# Patient Record
Sex: Male | Born: 1995 | Race: White | Hispanic: No | Marital: Single | Smoking: Never smoker
Health system: Southern US, Community
[De-identification: ages and names within clinical notes are randomized; demographics above are authoritative.]

## PROBLEM LIST (undated history)

## (undated) DIAGNOSIS — L709 Acne, unspecified: Secondary | ICD-10-CM

## (undated) HISTORY — DX: Acne, unspecified: L70.9

## (undated) HISTORY — PX: OTHER SURGICAL HISTORY: SHX169

---
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
## 2016-05-25 ENCOUNTER — Ambulatory Visit (INDEPENDENT_AMBULATORY_CARE_PROVIDER_SITE_OTHER): Payer: PRIVATE HEALTH INSURANCE | Admitting: Family

## 2016-05-25 ENCOUNTER — Encounter: Payer: Self-pay | Admitting: Family

## 2016-05-25 VITALS — BP 140/84 | HR 86 | Temp 98.5°F | Ht 69.0 in | Wt 206.0 lb

## 2016-05-25 DIAGNOSIS — B070 Plantar wart: Secondary | ICD-10-CM | POA: Diagnosis not present

## 2016-05-25 NOTE — Patient Instructions (Signed)
Plantar Warts Warts are small growths on the skin. They can occur on various areas of the body. When they occur on the underside (sole) of the foot, they are called plantar warts. Plantar warts often occur in groups, with several small warts around a larger growth. They tend to develop over areas of pressure, such as the heel or the ball of the foot. Most warts are not painful, and they usually do not cause problems. However, plantar warts may cause pain when you walk because pressure is applied to them. Warts often go away on their own in time. Various treatments may be done if needed. Sometimes, warts go away and then they come back again. CAUSES Plantar warts are caused by a type of virus that is called human papillomavirus (HPV). HPV attacks a break in the skin of the foot. Walking barefoot can lead to exposure to the virus. These warts may spread to other areas of the sole. They spread to other areas of the body only through direct contact. RISK FACTORS Plantar warts are more likely to develop in:  People who are 10-20 years of age.  People who use public showers or locker rooms.  People who have a weakened body defense system (immune system). SYMPTOMS Plantar warts may be flat or slightly raised. They may grow into the deeper layers of skin or rise above the surface of the skin. Most plantar warts have a rough surface. They may cause pain when you use your foot to support your body weight. DIAGNOSIS A plantar wart can usually be diagnosed from its appearance. In some cases, a tissue sample may be removed (biopsy) to be looked at under a microscope. TREATMENT In many cases, warts do not need treatment. Without treatment, they often go away over a period of many months to a couple years. If treatment is needed, options may include:  Applying medicated solutions, creams, or patches to the wart. These may be over-the-counter or prescription medicines that make the skin soft so that layers will  gradually shed away. In many cases, the medicine is applied one or two times per day and covered with a bandage.  Putting duct tape over the top of the wart (occlusion). You will leave the tape in place for as long as told by your health care provider, then you will replace it with a new strip of tape. This is done until the wart goes away.  Freezing the wart with liquid nitrogen (cryotherapy).  Burning the wart with:  Laser treatment.  An electrified probe (electrocautery).  Injection of a medicine (Candida antigen) into the wart to help the body's immune system to fight off the wart.  Surgery to remove the wart. HOME CARE INSTRUCTIONS  Apply medicated creams or solutions only as told by your health care provider. This may involve:  Soaking the affected area in warm water.  Removing the top layer of softened skin before you apply the medicine. A pumice stone works well for removing the tissue.  Applying a bandage over the affected area after you apply the medicine.  Repeating the process daily or as told by your health care provider.  Do not scratch or pick at a wart.  Wash your hands after you touch a wart.  If a wart is painful, try applying a bandage with a hole in the middle over the wart. The helps to take pressure off the wart.  Keep all follow-up visits as told by your health care provider. This is important. PREVENTION   Take these actions to help prevent warts:  Wear shoes and socks. Change your socks daily.  Keep your feet clean and dry.  Check your feet regularly.  Avoid direct contact with warts on other people. SEEK MEDICAL CARE IF:  Your warts do not improve after treatment.  You have redness, swelling, or pain at the site of a wart.  You have bleeding from a wart that does not stop with light pressure.  You have diabetes and you develop a wart.   This information is not intended to replace advice given to you by your health care provider. Make sure  you discuss any questions you have with your health care provider.   Document Released: 02/12/2004 Document Revised: 08/13/2015 Document Reviewed: 02/17/2015 Elsevier Interactive Patient Education 2016 [employer]  

## 2016-05-25 NOTE — Progress Notes (Signed)
   Subjective:    Patient ID: Dale Lloyd, male    DOB: 02-11-1996, 20 y.o.   MRN: [id number]  HPI PT presents to the office with a "bump" on the ball of his right foot. PT states it has been there for about 2 months and states it is unchanged. Pt states when he applies pressure it becomes painful. PT states he soaked it in water and scrubbed with no relief.    Review of Systems  Constitutional: Negative.   HENT: Negative.   Respiratory: Negative.   Cardiovascular: Negative.   Gastrointestinal: Negative.   Endocrine: Negative.   Genitourinary: Negative.   Musculoskeletal: Negative.   Neurological: Negative.   Hematological: Negative.   Psychiatric/Behavioral: Negative.   All other systems reviewed and are negative.      Objective:   Physical Exam  Constitutional: He is oriented to person, place, and time. He appears well-developed and well-nourished. No distress.  Eyes: Pupils are equal, round, and reactive to light. Right eye exhibits no discharge. Left eye exhibits no discharge.  Neck: Normal range of motion. Neck supple. No thyromegaly present.  Cardiovascular: Normal rate, regular rhythm, normal heart sounds and intact distal pulses.   No murmur heard. Pulmonary/Chest: Effort normal and breath sounds normal. No respiratory distress. He has no wheezes.  Abdominal: Soft. Bowel sounds are normal. He exhibits no distension. There is no tenderness.  Musculoskeletal: Normal range of motion. He exhibits no edema or tenderness.  Neurological: He is alert and oriented to person, place, and time.  Skin: Skin is warm and dry. No rash noted. No erythema.  Small plantar wart present on ball of right foot  Psychiatric: He has a normal mood and affect. His behavior is normal. Judgment and thought content normal.  Vitals reviewed.   BP 140/84 mmHg  Pulse 86  Temp(Src) 98.5 F (36.9 C) (Oral)  Ht 5\' 9"  (1.753 m)  Wt 206 lb (93.441 kg)  BMI 30.41 kg/m2  cryotherapy used on ball of  right foot for one plantar wart. Tolerated well     Assessment & Plan:  1. Plantar wart -Keep clean and dry -Do not pick at or squeeze -RTO prn   Jannifer Rodneyhristy Derrel Moore, FNP

## 2016-06-10 ENCOUNTER — Ambulatory Visit: Payer: PRIVATE HEALTH INSURANCE | Admitting: Family Medicine

## 2016-06-11 ENCOUNTER — Encounter: Payer: Self-pay | Admitting: Family Medicine

## 2016-06-15 ENCOUNTER — Encounter: Payer: Self-pay | Admitting: Family Medicine

## 2016-06-15 ENCOUNTER — Ambulatory Visit (INDEPENDENT_AMBULATORY_CARE_PROVIDER_SITE_OTHER): Payer: PRIVATE HEALTH INSURANCE | Admitting: Family Medicine

## 2016-06-15 VITALS — BP 119/78 | HR 78 | Temp 97.3°F | Ht 69.01 in | Wt 209.2 lb

## 2016-06-15 DIAGNOSIS — B070 Plantar wart: Secondary | ICD-10-CM | POA: Diagnosis not present

## 2016-06-15 NOTE — Progress Notes (Signed)
   Subjective:    Patient ID: Dale Lloyd, male    DOB: [birth year], 20 y.o.   MRN: [id number]  HPI Patient is here today for a plantar wart on the bottom of his right foot. Had wart treated previously on June 20. I explained that to get rather plantar wart it's the process of freezing paring freezing paring until the cores actually removed this should occur every 2 weeks and we need to be aggressive to really get rid of it  Review of Systems  Constitutional: Negative.   HENT: Negative.   Eyes: Negative.   Respiratory: Negative.   Cardiovascular: Negative.   Gastrointestinal: Negative.   Endocrine: Negative.   Genitourinary: Negative.   Musculoskeletal: Negative.   Skin:       Plantar wart bottom of right foot  Allergic/Immunologic: Negative.   Neurological: Negative.   Hematological: Negative.   Psychiatric/Behavioral: Negative.        [facility] 06/15/2016 05/25/2016 04/08/2015  Decreased Interest 0 0 0  Down, Depressed, Hopeless 0 0 0  PHQ - 2 Score 0 0 0      Patient Active Problem List   Diagnosis Date Noted  . Annual physical exam 04/08/2015  . Allergic rhinitis 05/03/2013   Outpatient Encounter Prescriptions as of 06/15/2016  Medication Sig  . Cetirizine HCl (ZYRTEC ALLERGY PO) Take by mouth.  . [DISCONTINUED] tretinoin (RETIN-A) 0.1 % cream Apply topically at bedtime. Reported on 05/25/2016   No facility-administered encounter medications on file as of 06/15/2016.       Objective:   Physical Exam  Skin:  Wart aired with #15 blade and then frozen with liquid nitrogen. Patient was explained this may form a blister was instructed to treat it as he would any other blister on the bottom of his foot. Instructed to return in 2 weeks for follow-up treatment    BP 119/78 mmHg  Pulse 78  Temp(Src) 97.3 F (36.3 C) (Oral)  Ht 5' 9.01" (1.753 m)  Wt 209 lb 3.2 oz (94.892 kg)  BMI 30.88 kg/m2       Assessment & Plan:

## 2016-07-09 ENCOUNTER — Ambulatory Visit: Payer: PRIVATE HEALTH INSURANCE | Admitting: Family Medicine

## 2016-07-14 ENCOUNTER — Encounter: Payer: Self-pay | Admitting: Family Medicine

## 2016-07-14 ENCOUNTER — Ambulatory Visit (INDEPENDENT_AMBULATORY_CARE_PROVIDER_SITE_OTHER): Payer: PRIVATE HEALTH INSURANCE | Admitting: Family Medicine

## 2016-07-14 DIAGNOSIS — B070 Plantar wart: Secondary | ICD-10-CM | POA: Diagnosis not present

## 2016-07-14 NOTE — Progress Notes (Signed)
   Subjective:    Patient ID: Dale Lloyd, male    DOB: [birth year], 20 y.o.   MRN: [id number]  HPI 2 weeks ago we did the initial liquid nitrogen freeze. Patient used some sort of paring instrument and removed dead skin last night or to his visit today. He has not had any major symptoms or issues with the procedure.  Patient Active Problem List   Diagnosis Date Noted  . Annual physical exam 04/08/2015  . Allergic rhinitis 05/03/2013   Outpatient Encounter Prescriptions as of 07/14/2016  Medication Sig  . Cetirizine HCl (ZYRTEC ALLERGY PO) Take by mouth.   No facility-administered encounter medications on file as of 07/14/2016.       Review of Systems  Constitutional: Negative.   HENT: Negative.   Eyes: Negative.   Respiratory: Negative.  Negative for shortness of breath.   Cardiovascular: Negative.  Negative for chest pain and leg swelling.  Gastrointestinal: Negative.   Genitourinary: Negative.   Musculoskeletal: Negative.   Skin: Negative.   Neurological: Negative.   Psychiatric/Behavioral: Negative.   All other systems reviewed and are negative.      Objective:   Physical Exam  Skin:  The plantar wart is much smaller and barely palpable. There is no skin left to debride so the area was frozen followed and refrozen. He is leaving to [place] later this [place]. I have told him he can come back when he is in town if he has any questions about that was looking but he had such a good response I suspect what we have done will probably cure the problem for now.   BP 132/83 (BP Location: Right Arm, Patient Position: Sitting, Cuff Size: Normal)   Pulse 72   Temp 97.5 F (36.4 C) (Oral)   Ht 5\' 9"  (1.753 m)   Wt 204 lb 6.4 oz (92.7 kg)   BMI 30.18 kg/m         Assessment & Plan:  1. Plantar wart See discussion above. He will return as needed  Frederica KusterStephen M Madesyn Ast MD

## 2016-11-19 ENCOUNTER — Ambulatory Visit (INDEPENDENT_AMBULATORY_CARE_PROVIDER_SITE_OTHER): Payer: PRIVATE HEALTH INSURANCE | Admitting: Nurse Practitioner

## 2016-11-19 ENCOUNTER — Encounter: Payer: Self-pay | Admitting: Nurse Practitioner

## 2016-11-19 VITALS — BP 128/79 | HR 99 | Temp 97.3°F | Ht 69.0 in | Wt 220.0 lb

## 2016-11-19 DIAGNOSIS — Z0000 Encounter for general adult medical examination without abnormal findings: Secondary | ICD-10-CM

## 2016-11-19 NOTE — Progress Notes (Signed)
Subjective:    Patient ID: Dale Lloyd, male    DOB: 1995-12-24, 20 y.o.   MRN: [id number]  HPI  Patient comes in today for annual physical exam. 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 He is doing well without complaints.  Review of Systems  Constitutional: Negative.  Negative for diaphoresis.  Eyes: Negative for pain.  Respiratory: Negative for shortness of breath.   Cardiovascular: Negative for chest pain, palpitations and leg swelling.  Gastrointestinal: Negative for abdominal pain.  Endocrine: Negative for polydipsia.  Skin: Negative for rash.  Neurological: Negative for dizziness, weakness and headaches.  Hematological: Does not bruise/bleed easily.  Psychiatric/Behavioral: Negative.   All other systems reviewed and are negative.      Objective:   Physical Exam  Constitutional: He is oriented to person, place, and time. He appears well-developed and well-nourished.  HENT:  Head: Normocephalic.  Right Ear: External ear normal.  Left Ear: External ear normal.  Nose: Nose normal.  Mouth/Throat: Oropharynx is clear and moist.  Eyes: EOM are normal. Pupils are equal, round, and reactive to light.  Neck: Normal range of motion. Neck supple. No JVD present. No thyromegaly present.  Cardiovascular: Normal rate, regular rhythm, normal heart sounds and intact distal pulses.  Exam reveals no gallop and no friction rub.   No murmur heard. Pulmonary/Chest: Effort normal and breath sounds normal. No respiratory distress. He has no wheezes. He has no rales. He exhibits no tenderness.  Abdominal: Soft. Bowel sounds are normal. He exhibits no mass. There is no tenderness.  Genitourinary: Prostate normal and penis normal.  Musculoskeletal: Normal range of motion. He exhibits no edema.  Lymphadenopathy:    He has no cervical adenopathy.  Neurological: He is alert and oriented to person, place, and time. No cranial nerve deficit.    Skin: Skin is warm and dry.  Psychiatric: He has a normal mood and affect. His behavior is normal. Judgment and thought content normal.   BP 128/79   Pulse 99   Temp 97.3 F (36.3 C) (Oral)   Ht 5' 9" (1.753 m)   Wt 220 lb (99.8 kg)   BMI 32.49 kg/m        Assessment & Plan:  1. Annual physical exam - CBC with Differential/Platelet - CMP14+EGFR - Lipid panel    Labs pending Health maintenance reviewed Diet and exercise encouraged Continue all meds Follow up  In 1 year   [place], FNP

## 2016-11-19 NOTE — Patient Instructions (Signed)

## 2016-11-20 LAB — CMP14+EGFR
A/G RATIO: 1.9 (ref 1.2–2.2)
ALT: 36 IU/L (ref 0–44)
AST: 22 IU/L (ref 0–40)
Albumin: 4.8 g/dL (ref 3.5–5.5)
Alkaline Phosphatase: 91 IU/L (ref 39–117)
BILIRUBIN TOTAL: 0.3 mg/dL (ref 0.0–1.2)
BUN/Creatinine Ratio: 14 (ref 9–20)
BUN: 11 mg/dL (ref 6–20)
CHLORIDE: 101 mmol/L (ref 96–106)
CO2: 24 mmol/L (ref 18–29)
Calcium: 9.3 mg/dL (ref 8.7–10.2)
Creatinine, Ser: 0.8 mg/dL (ref 0.76–1.27)
GFR calc Af Amer: 150 mL/min/{1.73_m2} (ref >59)
GFR calc non Af Amer: 130 mL/min/{1.73_m2} (ref >59)
GLOBULIN, TOTAL: 2.5 g/dL (ref 1.5–4.5)
Glucose: 103 mg/dL — ABNORMAL HIGH (ref 65–99)
POTASSIUM: 4.4 mmol/L (ref 3.5–5.2)
SODIUM: 142 mmol/L (ref 134–144)
Total Protein: 7.3 g/dL (ref 6.0–8.5)

## 2016-11-20 LAB — CBC WITH DIFFERENTIAL/PLATELET
BASOS: 1 %
Basophils Absolute: 0.1 10*3/uL (ref 0.0–0.2)
EOS (ABSOLUTE): 0.3 10*3/uL (ref 0.0–0.4)
Eos: 4 %
Hematocrit: 44.6 % (ref 37.5–51.0)
Hemoglobin: 15.1 g/dL (ref 13.0–17.7)
Immature Grans (Abs): 0 10*3/uL (ref 0.0–0.1)
Immature Granulocytes: 0 %
LYMPHS ABS: 2.1 10*3/uL (ref 0.7–3.1)
Lymphs: 32 %
MCH: 29.9 pg (ref 26.6–33.0)
MCHC: 33.9 g/dL (ref 31.5–35.7)
MCV: 88 fL (ref 79–97)
MONOS ABS: 0.8 10*3/uL (ref 0.1–0.9)
Monocytes: 12 %
NEUTROS ABS: 3.3 10*3/uL (ref 1.4–7.0)
NEUTROS PCT: 51 %
PLATELETS: 280 10*3/uL (ref 150–379)
RBC: 5.05 x10E6/uL (ref 4.14–5.80)
RDW: 13.2 % (ref 12.3–15.4)
WBC: 6.6 10*3/uL (ref 3.4–10.8)

## 2016-11-20 LAB — LIPID PANEL
Chol/HDL Ratio: 4.5 ratio units (ref 0.0–5.0)
Cholesterol, Total: 183 mg/dL — ABNORMAL HIGH (ref 100–169)
HDL: 41 mg/dL (ref >39)
LDL Calculated: 108 mg/dL (ref 0–109)
TRIGLYCERIDES: 171 mg/dL — AB (ref 0–89)
VLDL Cholesterol Cal: 34 mg/dL (ref 5–40)
